# Patient Record
Sex: Male | Born: 1995 | Race: White | Hispanic: No | Marital: Single | Smoking: Never smoker
Health system: Southern US, Community
[De-identification: ages and names within clinical notes are randomized; demographics above are authoritative.]

---

## 2016-08-08 ENCOUNTER — Emergency Department (HOSPITAL_BASED_OUTPATIENT_CLINIC_OR_DEPARTMENT_OTHER): Payer: Worker's Compensation

## 2016-08-08 ENCOUNTER — Emergency Department (HOSPITAL_BASED_OUTPATIENT_CLINIC_OR_DEPARTMENT_OTHER)
Admission: EM | Admit: 2016-08-08 | Discharge: 2016-08-08 | Disposition: A | Discharge status: Home / Self Care | Payer: Worker's Compensation | Attending: Emergency Medicine | Admitting: Emergency Medicine

## 2016-08-08 ENCOUNTER — Encounter (HOSPITAL_BASED_OUTPATIENT_CLINIC_OR_DEPARTMENT_OTHER): Payer: Self-pay

## 2016-08-08 DIAGNOSIS — W268XXA Contact with other sharp object(s), not elsewhere classified, initial encounter: Secondary | ICD-10-CM | POA: Diagnosis not present

## 2016-08-08 DIAGNOSIS — Y939 Activity, unspecified: Secondary | ICD-10-CM | POA: Diagnosis not present

## 2016-08-08 DIAGNOSIS — F17220 Nicotine dependence, chewing tobacco, uncomplicated: Secondary | ICD-10-CM | POA: Diagnosis not present

## 2016-08-08 DIAGNOSIS — S61011A Laceration without foreign body of right thumb without damage to nail, initial encounter: Secondary | ICD-10-CM | POA: Insufficient documentation

## 2016-08-08 DIAGNOSIS — Y990 Civilian activity done for income or pay: Secondary | ICD-10-CM | POA: Diagnosis not present

## 2016-08-08 DIAGNOSIS — Y929 Unspecified place or not applicable: Secondary | ICD-10-CM | POA: Insufficient documentation

## 2016-08-08 MED ORDER — LIDOCAINE HCL (PF) 1 % IJ SOLN
5.0000 mL | Freq: Once | INTRAMUSCULAR | Status: AC
  Administered 2016-08-08: 5 mL
  Filled 2016-08-08: qty 5

## 2016-08-08 MED ORDER — TETANUS-DIPHTH-ACELL PERTUSSIS 5-2.5-18.5 LF-MCG/0.5 IM SUSP
0.5000 mL | Freq: Once | INTRAMUSCULAR | Status: AC
  Administered 2016-08-08: 0.5 mL via INTRAMUSCULAR
  Filled 2016-08-08: qty 0.5

## 2016-08-08 NOTE — ED Provider Notes (Signed)
[facility]   CSN: [id number] Arrival date & time: 08/08/16  2048     History   Chief Complaint Chief Complaint  Patient presents with  . Finger Injury    HPI Yobany Vroom is a 21 y.o. male.  HPI Patient presents after finger injury. He cut his right thumb on a strap at work. Unknown last tetanus. States it hurts over the whole time. No other injury.   History reviewed. No pertinent past medical history.  There are no active problems to display for this patient.   History reviewed. No pertinent surgical history.     Home Medications    Prior to Admission medications   Not on File    Family History No family history on file.  Social History Social History  Substance Use Topics  . Smoking status: Never Smoker  . Smokeless tobacco: Current User    Types: Chew  . Alcohol use Yes     Comment: occ     Allergies   Patient has no known allergies.   Review of Systems Review of Systems  Constitutional: Negative for appetite change.  Musculoskeletal:       Right thumb injury  Skin: Positive for wound.  Neurological: Negative for weakness and numbness.  Hematological: Does not bruise/bleed easily.     Physical Exam Updated Vital Signs BP 127/88 (BP Location: Left Arm)   Pulse 92   Temp 98.9 F (37.2 C) (Oral)   Resp 20   Ht 5\' 11"  (1.803 m)   Wt 100.8 kg (222 lb 4.8 oz)   SpO2 98%   BMI 31.00 kg/m   Physical Exam  Constitutional: He appears well-developed and well-nourished.  Musculoskeletal:  Distal phalanx of right thumb has a 1 cm vertical laceration. No nailbed involvement. Some tenderness over the distal phalanx. No active bleeding.  Skin: Skin is warm. Capillary refill takes less than 2 seconds.     ED Treatments / Results  Labs (all labs ordered are listed, but only abnormal results are displayed) Labs Reviewed - No data to display  EKG  EKG Interpretation None       Radiology Dg Finger Thumb  Right  Result Date: 08/08/2016 CLINICAL DATA:  21 y/o  M; laceration to tip of thumb. EXAM: RIGHT THUMB 2+V COMPARISON:  None. FINDINGS: There is no evidence of fracture or dislocation. There is no evidence of arthropathy or other focal bone abnormality. Soft tissue laceration of the tip of trauma. IMPRESSION: 1. No acute bony or articular abnormality. 2. Soft tissue laceration to tip of thumb. Electronically Signed   By: Mitzi Hansen M.D.   On: 08/08/2016 22:09    Procedures Procedures (including critical care time)  Medications Ordered in ED Medications  lidocaine (PF) (XYLOCAINE) 1 % injection 5 mL (5 mLs Infiltration Given by Other 08/08/16 2229)  Tdap (BOOSTRIX) injection 0.5 mL (0.5 mLs Intramuscular Given 08/08/16 2124)     Initial Impression / Assessment and Plan / ED Course  I have reviewed the triage vital signs and the nursing notes.  Pertinent labs & imaging results that were available during my care of the patient were reviewed by me and considered in my medical decision making (see chart for details).     Patient laceration to thumb. Closed. There is a tissue defect messing around 2-3 mm area of skin.  LACERATION REPAIR Performed by: Billee Cashing Authorized by: Billee Cashing Consent: Verbal consent obtained. Risks and benefits: risks, benefits and alternatives were discussed  Consent given by: patient Patient identity confirmed: provided demographic data Prepped and Draped in normal sterile fashion Wound explored  Laceration Location: right thumb  Laceration Length: 1.5cm  No Foreign Bodies seen or palpated  Anesthesia: local infiltration and digital block  Local anesthetic: lidocaine 1%  Anesthetic total: 4 ml  Irrigation method: syringe Amount of cleaning: standard  Skin closure: 5-0 vicryl rapide  Number of sutures: 5  Technique: simple interupted  Patient tolerance: Patient tolerated the procedure well with no immediate  complications.  Final Clinical Impressions(s) / ED Diagnoses   Final diagnoses:  Laceration of right thumb without foreign body without damage to nail, initial encounter    New Prescriptions New Prescriptions   No medications on file     Benjiman CorePickering, Hyla Coard, MD 08/08/16 2249

## 2016-08-08 NOTE — ED Notes (Signed)
ED Provider at bedside. 

## 2016-08-08 NOTE — ED Notes (Signed)
Five sutures to right thumb done by MD.

## 2016-08-08 NOTE — Discharge Instructions (Signed)
Sutures out in 7 days

## 2016-08-08 NOTE — ED Triage Notes (Signed)
Pt c/o strap buckle at work cut right thumb approx 715pm-lac noted to tip of thumb-NAD-steady gait

## 2019-07-14 ENCOUNTER — Other Ambulatory Visit: Payer: Self-pay

## 2019-07-14 ENCOUNTER — Encounter (HOSPITAL_BASED_OUTPATIENT_CLINIC_OR_DEPARTMENT_OTHER): Payer: Self-pay | Admitting: *Deleted

## 2019-07-14 DIAGNOSIS — W208XXA Other cause of strike by thrown, projected or falling object, initial encounter: Secondary | ICD-10-CM | POA: Diagnosis not present

## 2019-07-14 DIAGNOSIS — S0001XA Abrasion of scalp, initial encounter: Secondary | ICD-10-CM | POA: Diagnosis not present

## 2019-07-14 DIAGNOSIS — Y990 Civilian activity done for income or pay: Secondary | ICD-10-CM | POA: Diagnosis not present

## 2019-07-14 DIAGNOSIS — Y9259 Other trade areas as the place of occurrence of the external cause: Secondary | ICD-10-CM | POA: Insufficient documentation

## 2019-07-14 DIAGNOSIS — Y939 Activity, unspecified: Secondary | ICD-10-CM | POA: Diagnosis not present

## 2019-07-14 DIAGNOSIS — S0990XA Unspecified injury of head, initial encounter: Secondary | ICD-10-CM | POA: Diagnosis present

## 2019-07-14 NOTE — ED Triage Notes (Addendum)
Pt reports a metal bar fell at work and landed on his head.  approx 1inch lac noted to upper forehead. Denies LOC. Denies N/V.  Bleeding controlled.

## 2019-07-15 ENCOUNTER — Emergency Department (HOSPITAL_BASED_OUTPATIENT_CLINIC_OR_DEPARTMENT_OTHER): Payer: 59 | Attending: Emergency Medicine

## 2019-07-15 ENCOUNTER — Emergency Department (HOSPITAL_BASED_OUTPATIENT_CLINIC_OR_DEPARTMENT_OTHER)
Admission: EM | Admit: 2019-07-15 | Discharge: 2019-07-15 | Disposition: A | Discharge status: Home / Self Care | Payer: Worker's Compensation | Attending: Emergency Medicine | Admitting: Emergency Medicine

## 2019-07-15 DIAGNOSIS — S0001XA Abrasion of scalp, initial encounter: Secondary | ICD-10-CM

## 2019-07-15 DIAGNOSIS — S0003XA Contusion of scalp, initial encounter: Secondary | ICD-10-CM

## 2019-07-15 DIAGNOSIS — S0990XA Unspecified injury of head, initial encounter: Secondary | ICD-10-CM

## 2019-07-15 NOTE — ED Notes (Signed)
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

## 2019-07-15 NOTE — ED Provider Notes (Signed)
[facility] Provider Note: [name], MD, FACEP  CSN: [id number] MRN: [id number] ARRIVAL: 07/14/19 at 2251 ROOM: [facility]   CHIEF COMPLAINT  Head Injury   HISTORY OF PRESENT ILLNESS  07/15/19 2:33 AM Joseph Lloyd is a 24 y.o. male who had a metal bar fall at work striking him on his head.  This occurred yesterday evening.  He has a laceration to his upper forehead.  There was no loss of consciousness.  He denies nausea or vomiting.  He rates associated headache is 6 out of 10.   History reviewed. No pertinent past medical history.  History reviewed. No pertinent surgical history.  History reviewed. No pertinent family history.  Social History   Tobacco Use  . Smoking status: Never Smoker  . Smokeless tobacco: Current User    Types: Chew  Substance Use Topics  . Alcohol use: Yes    Comment: occ  . Drug use: Not on file    Prior to Admission medications   Not on File    Allergies Patient has no known allergies.   REVIEW OF SYSTEMS  Negative except as noted here or in the History of Present Illness.   PHYSICAL EXAMINATION  Initial Vital Signs Blood pressure (!) 153/83, pulse 89, temperature 98.5 F (36.9 C), temperature source Oral, resp. rate 18, height 5\' 11"  (1.803 m), weight 103.3 kg, SpO2 99 %.  Examination General: Well-developed, well-nourished male in no acute distress; appearance consistent with age of record HENT: normocephalic; no hemotympanum; abrasion and hematoma to mid forehead just above hairline:    Eyes: pupils equal, round and reactive to light; extraocular muscles intact Neck: supple Heart: regular rate and rhythm Lungs: clear to auscultation bilaterally Abdomen: soft; nondistended; nontender; bowel sounds present Extremities: No deformity; full range of motion; pulses normal Neurologic: Awake, alert and oriented; motor function intact in all extremities and symmetric; no facial droop Skin: Warm and  dry Psychiatric: Normal mood and affect   RESULTS  Summary of this visit's results, reviewed and interpreted by myself:   EKG Interpretation  Date/Time:    Ventricular Rate:    PR Interval:    QRS Duration:   QT Interval:    QTC Calculation:   R Axis:     Text Interpretation:        Laboratory Studies: No results found for this or any previous visit (from the past 24 hour(s)). Imaging Studies: CT Head Wo Contrast  Result Date: 07/15/2019 CLINICAL DATA:  Hit metal bar EXAM: CT HEAD WITHOUT CONTRAST TECHNIQUE: Contiguous axial images were obtained from the base of the skull through the vertex without intravenous contrast. COMPARISON:  None. FINDINGS: Brain: No evidence of acute territorial infarction, hemorrhage, hydrocephalus,extra-axial collection or mass lesion/mass effect. Normal gray-white differentiation. Ventricles are normal in size and contour. Vascular: No hyperdense vessel or unexpected calcification. Skull: The skull is intact. No fracture or focal lesion identified. Sinuses/Orbits: The visualized paranasal sinuses and mastoid air cells are clear. The orbits and globes intact. Other: Small area of soft tissue swelling seen over the left frontal skull. IMPRESSION: No acute intracranial abnormality. Electronically Signed   By: 07/17/2019 M.D.   On: 07/15/2019 03:41    ED COURSE and MDM  Nursing notes, initial and subsequent vitals signs, including pulse oximetry, reviewed and interpreted by myself.  Vitals:   07/14/19 2257 07/14/19 2259 07/15/19 0259  BP:  (!) 153/83 133/77  Pulse:  89 84  Resp:  18 18  Temp:  98.5 F (  36.9 C)   TempSrc:  Oral   SpO2:  99% 97%  Weight: 103.3 kg    Height: 5\' 11"  (1.803 m)     Medications - No data to display  Patient likely has a mild concussion but has a reassuring CT scan.  The wound on his forehead does not appear to need primary closure.  PROCEDURES  Procedures   ED DIAGNOSES     ICD-10-CM   1. Minor head injury,  initial encounter  S09.90XA   2. Hematoma of frontal scalp, initial encounter  S00.03XA   3. Abrasion of scalp, initial encounter  S00.01XA        Kaidance Pantoja, Jenny Reichmann, MD 07/15/19 [phone]

## 2019-07-15 NOTE — ED Notes (Signed)
Tetanus given 08/08/16 per visit note on 08/08/16

## 2021-11-27 IMAGING — CT CT HEAD W/O CM
3 series · 14 of 47 positions shown, 16 images · non-contrast
Comparison: None.

CLINICAL DATA: Hit metal bar

EXAM:
CT HEAD WITHOUT CONTRAST
TECHNIQUE: Contiguous axial images were obtained from the base of the skull
through the vertex without intravenous contrast.

[Series 2: head wo · axial · 0.46mm/px · z∈[-151,-26]mm · 8 of 31 slices shown, 10 images]
[im 3/31  brain]
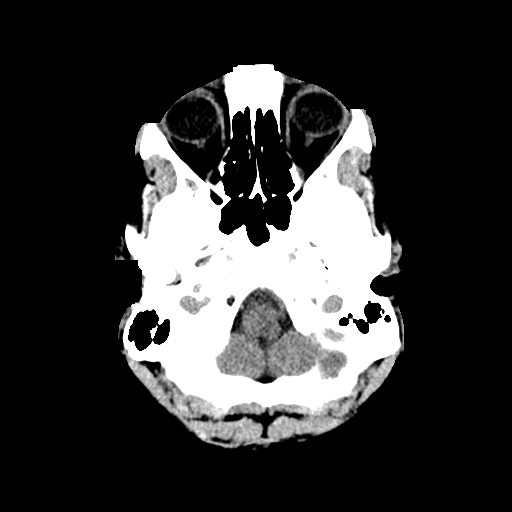
[im 3/31  bone]
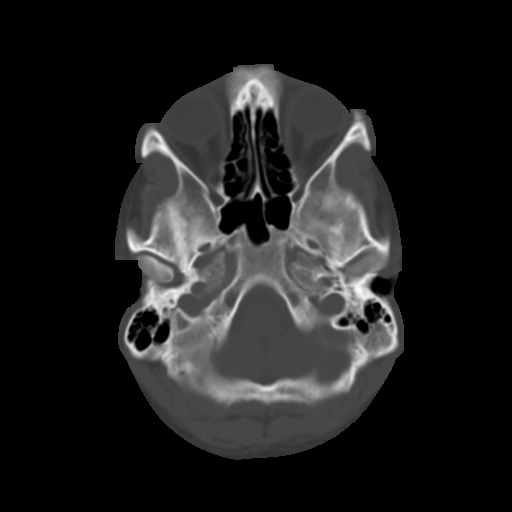
[im 7/31  brain]
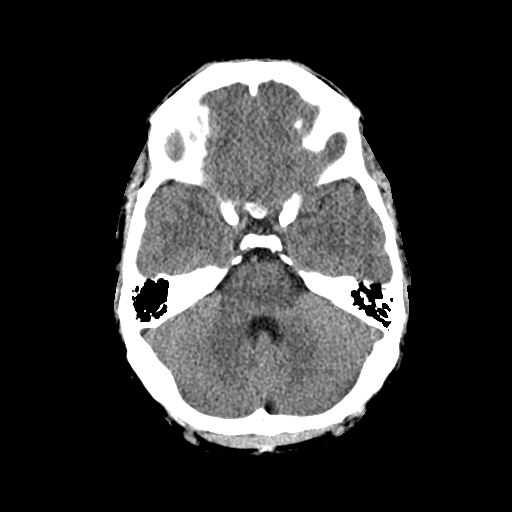
[im 10/31  brain]
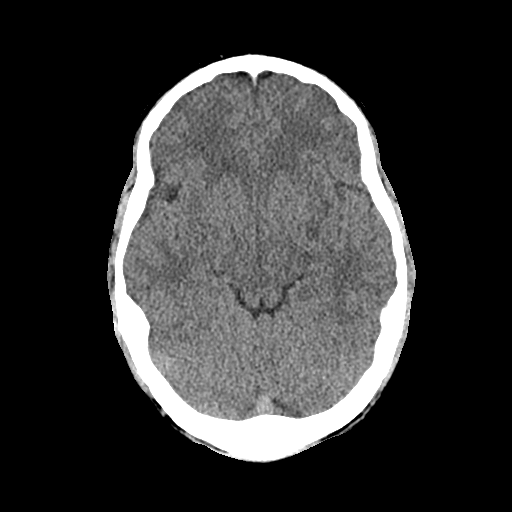
[im 14/31  brain]
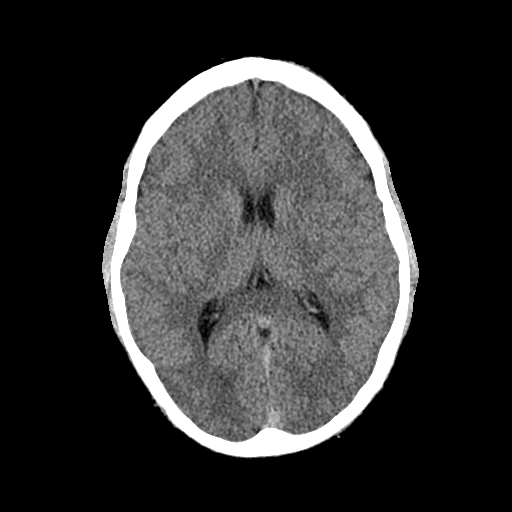
[im 17/31  brain]
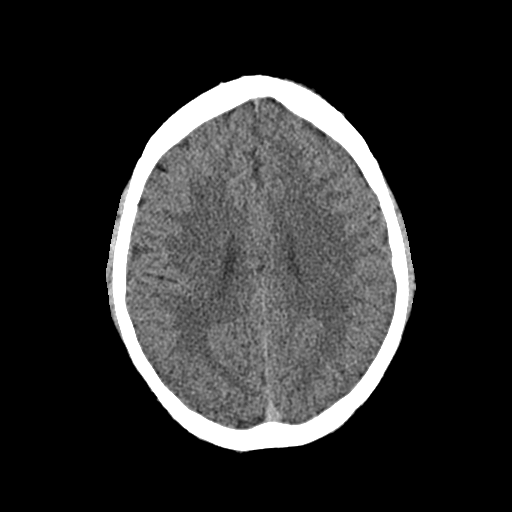
[im 17/31  bone]
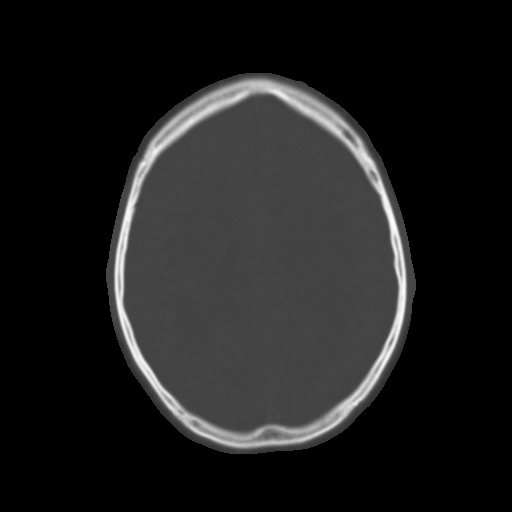
[im 21/31  brain]
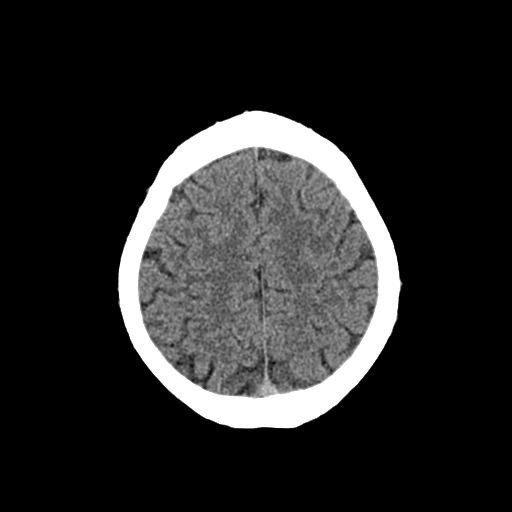
[im 24/31  brain]
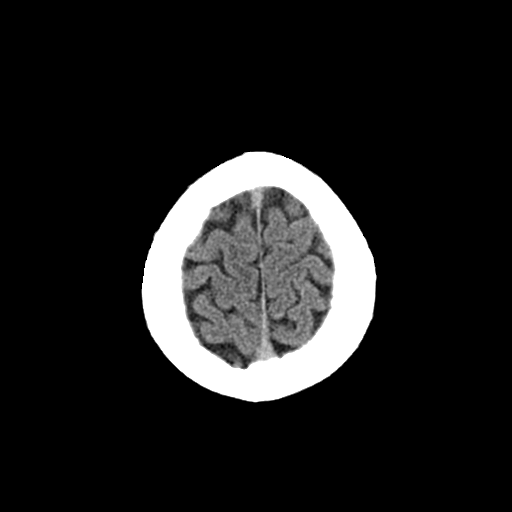
[im 28/31  brain]
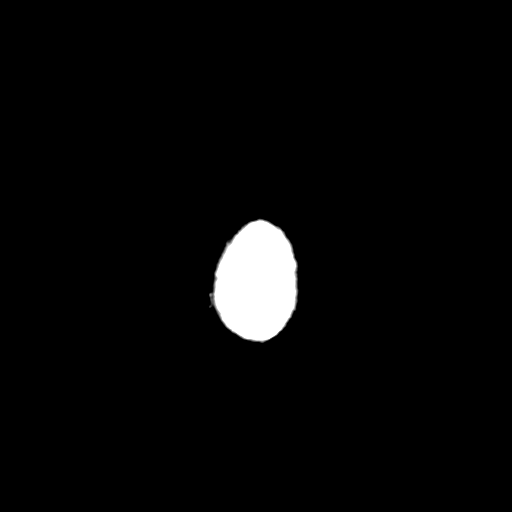

[Series 4: cor soft · coronal · 0.30mm/px · 3 of 68 slices shown]
[im 23/68  brain]
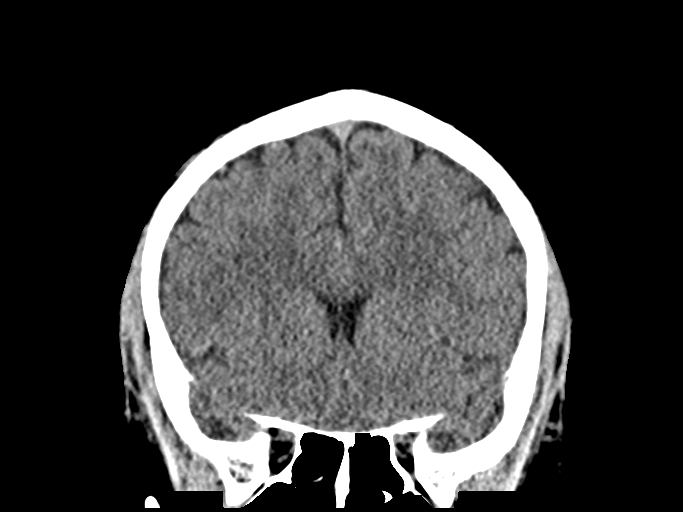
[im 30/68  brain]
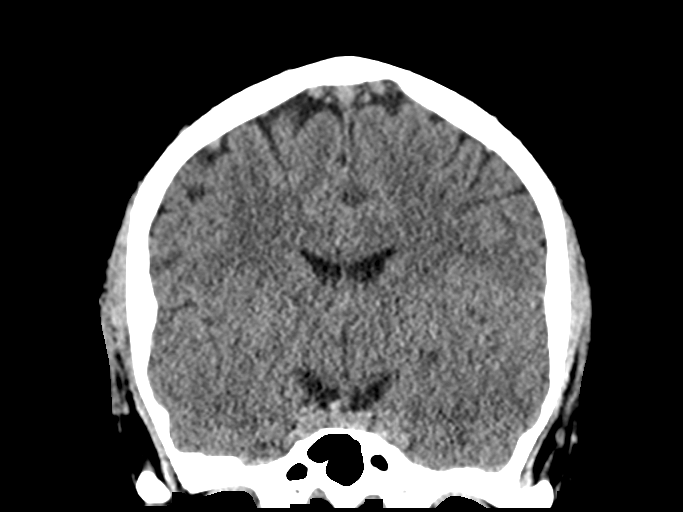
[im 38/68  brain]
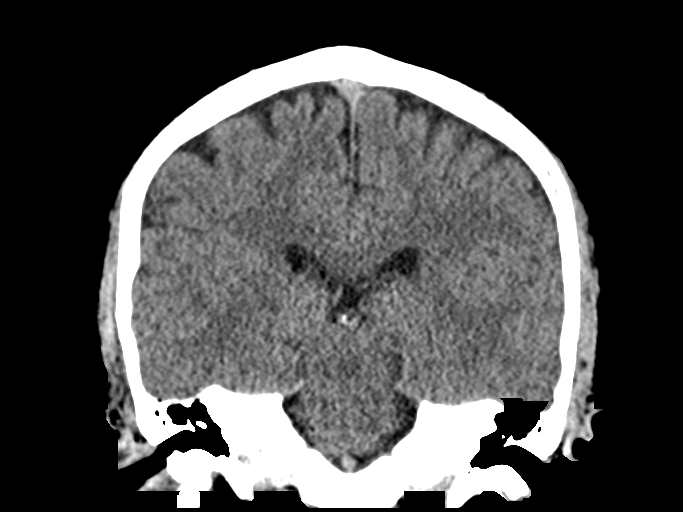

[Series 5: sag soft · sagittal · 0.31mm/px · 3 of 54 slices shown]
[im 18/54  brain]
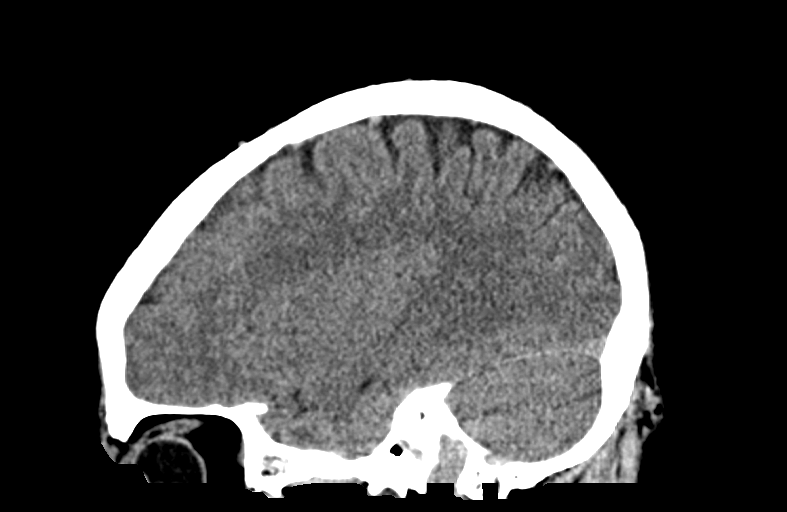
[im 27/54  brain]
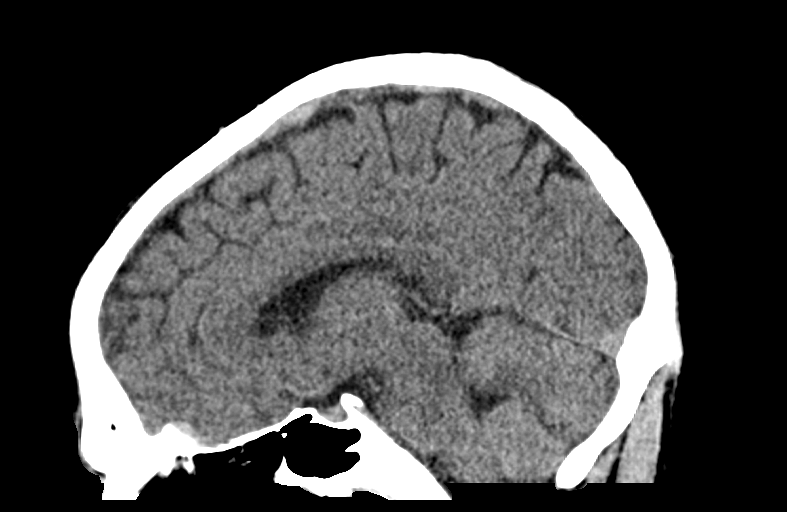
[im 36/54  brain]
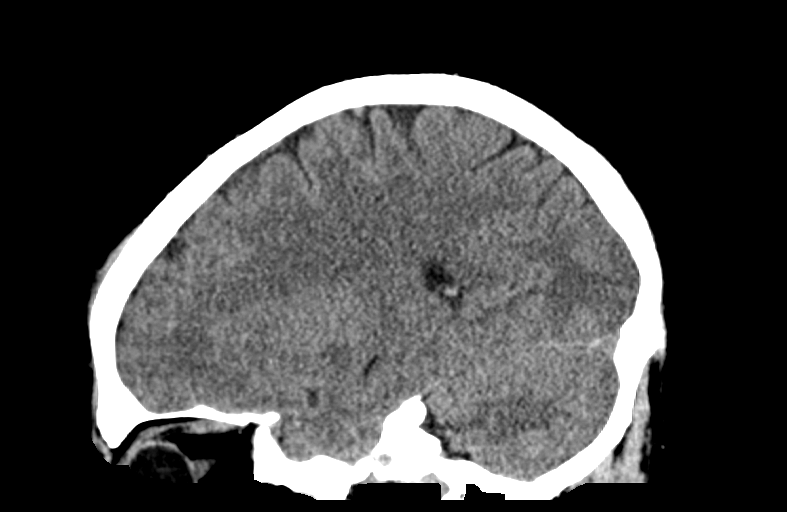

[14 of 47 positions shown; findings below may reference images not displayed]

FINDINGS: Brain: No evidence of acute territorial infarction, hemorrhage,
hydrocephalus,extra-axial collection or mass lesion/mass effect.
Normal gray-white differentiation. Ventricles are normal in size and
contour.

Vascular: No hyperdense vessel or unexpected calcification.

Skull: The skull is intact. No fracture or focal lesion identified.

Sinuses/Orbits: The visualized paranasal sinuses and mastoid air
cells are clear. The orbits and globes intact.

Other: Small area of soft tissue swelling seen over the left frontal
skull.
IMPRESSION: No acute intracranial abnormality.
